# Patient Record
Sex: Female | Born: 1990 | Race: Black or African American | Hispanic: No | Marital: Single | Smoking: Never smoker
Health system: Southern US, Community
[De-identification: ages and names within clinical notes are randomized; demographics above are authoritative.]

## PROBLEM LIST (undated history)

## (undated) ENCOUNTER — Inpatient Hospital Stay (HOSPITAL_COMMUNITY): Payer: Self-pay

---
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
## 2010-12-14 LAB — CBC
Hemoglobin: 8.4 g/dL — ABNORMAL LOW (ref 12.0–15.0)
RBC: 3.31 MIL/uL — ABNORMAL LOW (ref 3.87–5.11)

## 2010-12-15 LAB — CBC
HCT: 29 % — ABNORMAL LOW (ref 36.0–46.0)
HCT: 34.2 % — ABNORMAL LOW (ref 36.0–46.0)
Hemoglobin: 11 g/dL — ABNORMAL LOW (ref 12.0–15.0)
MCHC: 32.2 g/dL (ref 30.0–36.0)
MCV: 78.8 fL (ref 78.0–100.0)
MCV: 78.9 fL (ref 78.0–100.0)
Platelets: 134 10*3/uL — ABNORMAL LOW (ref 150–400)
RBC: 4.34 MIL/uL (ref 3.87–5.11)
RDW: 18 % — ABNORMAL HIGH (ref 11.5–15.5)

## 2011-03-09 ENCOUNTER — Inpatient Hospital Stay (INDEPENDENT_AMBULATORY_CARE_PROVIDER_SITE_OTHER)
Admission: RE | Admit: 2011-03-09 | Discharge: 2011-03-09 | Disposition: A | Discharge status: Home / Self Care | Payer: Self-pay | Source: Ambulatory Visit | Attending: Emergency Medicine | Admitting: Emergency Medicine

## 2011-03-09 DIAGNOSIS — R112 Nausea with vomiting, unspecified: Secondary | ICD-10-CM

## 2011-03-09 DIAGNOSIS — R197 Diarrhea, unspecified: Secondary | ICD-10-CM

## 2011-03-09 LAB — POCT I-STAT, CHEM 8
BUN: 22 mg/dL (ref 6–23)
Calcium, Ion: 1.15 mmol/L (ref 1.12–1.32)
Glucose, Bld: 92 mg/dL (ref 70–99)
HCT: 49 % — ABNORMAL HIGH (ref 36.0–46.0)
TCO2: 27 mmol/L (ref 0–100)

## 2011-03-09 LAB — POCT URINALYSIS DIP (DEVICE)
Leukocytes, UA: NEGATIVE
Nitrite: NEGATIVE
Protein, ur: 30 mg/dL — AB
pH: 5.5 (ref 5.0–8.0)

## 2011-03-09 LAB — POCT PREGNANCY, URINE: Preg Test, Ur: NEGATIVE

## 2014-10-15 ENCOUNTER — Telehealth: Payer: Self-pay | Admitting: *Deleted

## 2014-10-15 NOTE — Telephone Encounter (Addendum)
Pt left message stating that she was referred to our office for Mirena removal. She has been having cramps and abnormal bleeding. She had the Mirena inserted in May of 2011 and has just started having problems. Her regular [facility] is [facility] [place]. I returned pt's call and advised her that I do not see referral information in her EMR. She stated that she was seen 3 weeks ago and has since been told by her doctor's office that they sent the referral information. I told pt that I will check with our scheduling staff and she will receive a call back within a few days. She stated that a message can be left on her voice mail.   *Message sent to registration staff, awaiting response.   2/4  1515  Called pt and left message on her personal voice mail stating that we have not received any referral information from her doctor's office.  She may schedule an appt in our office for IUD removal if she would like however the visit is not free.  She would need to make a payment on the Jamiya Nims of the appt and then would be billed for the remainder. The visit would cost approximately $250 and possibly more. She may also [facility] to [facility] as her cost would most likely be less. I stated the telephone numbers for our clinic and for the [facility] if she desires to schedule appt. She may also call back if she has additional questions.

## 2014-10-31 ENCOUNTER — Encounter: Payer: Self-pay | Admitting: *Deleted

## 2014-11-21 ENCOUNTER — Ambulatory Visit (INDEPENDENT_AMBULATORY_CARE_PROVIDER_SITE_OTHER): Payer: Self-pay | Admitting: Family Medicine

## 2014-11-21 ENCOUNTER — Encounter: Payer: Self-pay | Admitting: Family Medicine

## 2014-11-21 VITALS — BP 109/57 | HR 65 | Temp 98.7°F | Ht 64.0 in | Wt 132.2 lb

## 2014-11-21 DIAGNOSIS — Z30432 Encounter for removal of intrauterine contraceptive device: Secondary | ICD-10-CM

## 2014-11-21 MED ORDER — NORGESTIMATE-ETH ESTRADIOL 0.25-35 MG-MCG PO TABS: 1.0000 | ORAL_TABLET | Freq: Every day | ORAL | Status: AC

## 2014-11-21 NOTE — Progress Notes (Signed)
Patient here today for IUD removal. Had in placed in May 2011. Patient would like it removed because it is almost expired and she has been continuously bleeding since December 2015.

## 2014-11-21 NOTE — Progress Notes (Signed)
IUD Removal  Patient was in the dorsal lithotomy position, normal external genitalia was noted.  A speculum was placed in the patient's vagina, normal discharge was noted, no lesions. The multiparous cervix was visualized, no lesions, no abnormal discharge,  and was swabbed with Betadine using scopettes.  The strings of the IUD was grasped and pulled using ring forceps.  The IUD was successfully removed in its entirety.  Patient tolerated the procedure well.    Sprintec prescribed.  Side effects of medication discussed.

## 2015-01-01 ENCOUNTER — Encounter: Payer: Self-pay | Admitting: *Deleted

## 2016-05-12 ENCOUNTER — Emergency Department (HOSPITAL_BASED_OUTPATIENT_CLINIC_OR_DEPARTMENT_OTHER)
Admission: EM | Admit: 2016-05-12 | Discharge: 2016-05-12 | Disposition: A | Discharge status: Home / Self Care | Payer: Medicaid Other | Attending: Emergency Medicine | Admitting: Emergency Medicine

## 2016-05-12 ENCOUNTER — Emergency Department (HOSPITAL_BASED_OUTPATIENT_CLINIC_OR_DEPARTMENT_OTHER): Payer: Medicaid Other

## 2016-05-12 ENCOUNTER — Encounter (HOSPITAL_BASED_OUTPATIENT_CLINIC_OR_DEPARTMENT_OTHER): Payer: Self-pay

## 2016-05-12 DIAGNOSIS — R102 Pelvic and perineal pain: Secondary | ICD-10-CM | POA: Insufficient documentation

## 2016-05-12 DIAGNOSIS — O26891 Other specified pregnancy related conditions, first trimester: Secondary | ICD-10-CM | POA: Diagnosis present

## 2016-05-12 DIAGNOSIS — Z3A01 Less than 8 weeks gestation of pregnancy: Secondary | ICD-10-CM | POA: Diagnosis not present

## 2016-05-12 DIAGNOSIS — N938 Other specified abnormal uterine and vaginal bleeding: Secondary | ICD-10-CM | POA: Diagnosis not present

## 2016-05-12 DIAGNOSIS — O209 Hemorrhage in early pregnancy, unspecified: Secondary | ICD-10-CM | POA: Insufficient documentation

## 2016-05-12 DIAGNOSIS — IMO0001 Reserved for inherently not codable concepts without codable children: Secondary | ICD-10-CM

## 2016-05-12 DIAGNOSIS — O200 Threatened abortion: Secondary | ICD-10-CM

## 2016-05-12 LAB — CBC
HCT: 32.6 % — ABNORMAL LOW (ref 36.0–46.0)
Hemoglobin: 11.2 g/dL — ABNORMAL LOW (ref 12.0–15.0)
MCH: 27.7 pg (ref 26.0–34.0)
MCHC: 34.4 g/dL (ref 30.0–36.0)
MCV: 80.7 fL (ref 78.0–100.0)
PLATELETS: 181 10*3/uL (ref 150–400)
RBC: 4.04 MIL/uL (ref 3.87–5.11)
RDW: 12.9 % (ref 11.5–15.5)
WBC: 8.2 10*3/uL (ref 4.0–10.5)

## 2016-05-12 LAB — PREGNANCY, URINE: Preg Test, Ur: POSITIVE — AB

## 2016-05-12 LAB — WET PREP, GENITAL
Sperm: NONE SEEN
Trich, Wet Prep: NONE SEEN
Yeast Wet Prep HPF POC: NONE SEEN

## 2016-05-12 LAB — ABO/RH: ABO/RH(D): B POS

## 2016-05-12 LAB — HCG, QUANTITATIVE, PREGNANCY: HCG, BETA CHAIN, QUANT, S: 94766 m[IU]/mL — AB (ref <5)

## 2016-05-12 NOTE — ED Provider Notes (Signed)
[facility] Note   CSN: [id number] Arrival date & time: 05/12/16  1951  By signing my name below, I, Jasmyn B. Alexander, attest that this documentation has been prepared under the direction and in the presence of Pricilla Loveless, MD. Electronically Signed: Gillis Ends. Lyn Hollingshead, ED Scribe. 05/12/16. 8:16 PM.  History   Chief Complaint Chief Complaint  Patient presents with  . Vaginal Bleeding    The history is provided by the patient. No language interpreter was used.    HPI Comments: Laurie Evans is a 25 y.o. female who presents to the Emergency Department complaining of intermittent, mild vaginal bleeding x 1 day. She noted light "spotting" on the tissue after wiping. Pt states that blood was present on tissue on 05/11/16 and also today. She visited a [facility] clinic and was told that she is currently [redacted] weeks pregnant after urine pregnancy test. She has not received an ultrasound to confirm fetal heartbeat. Pt has an appointment with her PCP on 06/04/16. Pt has no associated symptoms. No alleviating factors noted. Father's blood type is A+. Pt does not know her blood type. Denies any dizziness, lightheadedness, abdominal pain, vaginal discharge, dysuria, or hematuria. LNMP was on July 28th. Pt has only been pregnant once before, G2P1.  History reviewed. No pertinent past medical history.  There are no active problems to display for this patient.  History reviewed. No pertinent surgical history.  OB History    Gravida Para Term Preterm AB Living   2 1 1  0 0 2   SAB TAB Ectopic Multiple Live Births   0 0 0 0 1     Home Medications    Prior to Admission medications   Medication Sig Start Date End Date Taking? Authorizing Provider  ferrous sulfate 325 (65 FE) MG tablet Take 325 mg by mouth daily with breakfast.    Historical Provider, MD  norgestimate-ethinyl estradiol (ORTHO-CYCLEN,SPRINTEC,PREVIFEM) 0.25-35 MG-MCG tablet Take 1 tablet by mouth daily. 11/21/14    Levie Heritage, DO    Family History No family history on file.  Social History Social History  Substance Use Topics  . Smoking status: Never Smoker  . Smokeless tobacco: Never Used  . Alcohol use Yes     Comment: social      Allergies   Penicillins   Review of Systems Review of Systems  Gastrointestinal: Negative for abdominal pain.  Genitourinary: Positive for vaginal bleeding. Negative for dysuria, hematuria and vaginal discharge.  Neurological: Negative for dizziness and headaches.  All other systems reviewed and are negative.  Physical Exam Updated Vital Signs BP 103/68 (BP Location: Left Arm)   Pulse 92   Temp 98.2 F (36.8 C) (Oral)   Resp 16   Ht 5\' 4"  (1.626 m)   Wt 120 lb (54.4 kg)   SpO2 100%   BMI 20.60 kg/m   Physical Exam  Constitutional: She is oriented to person, place, and time. She appears well-developed and well-nourished.  HENT:  Head: Normocephalic and atraumatic.  Right Ear: External ear normal.  Left Ear: External ear normal.  Nose: Nose normal.  Eyes: Right eye exhibits no discharge. Left eye exhibits no discharge.  Cardiovascular: Normal rate, regular rhythm and normal heart sounds.   Pulmonary/Chest: Effort normal and breath sounds normal.  Abdominal: Soft. There is no tenderness.  Genitourinary: Uterus is not tender. Right adnexum displays no mass and no tenderness. Left adnexum displays no mass and no tenderness. There is bleeding in the vagina.  Neurological: She is alert and oriented to person, place, and time.  Skin: Skin is warm and dry.  Nursing note and vitals reviewed.  ED Treatments / Results  DIAGNOSTIC STUDIES: Oxygen Saturation is 100% on RA, normal by my interpretation.    COORDINATION OF CARE: 8:11 PM-Discussed treatment plan which includes CBC, Pelvic, GC/Chlamydia, and HcG Quant with pt at bedside and pt agreed to plan.   Labs (all labs ordered are listed, but only abnormal results are displayed) Labs  Reviewed  WET PREP, GENITAL - Abnormal; Notable for the following:       Result Value   Clue Cells Wet Prep HPF POC PRESENT (*)    WBC, Wet Prep HPF POC MODERATE (*)    All other components within normal limits  PREGNANCY, URINE - Abnormal; Notable for the following:    Preg Test, Ur POSITIVE (*)    All other components within normal limits  CBC - Abnormal; Notable for the following:    Hemoglobin 11.2 (*)    HCT 32.6 (*)    All other components within normal limits  HCG, QUANTITATIVE, PREGNANCY - Abnormal; Notable for the following:    hCG, Beta Chain, Quant, S 94,766 (*)    All other components within normal limits  ABO/RH  GC/CHLAMYDIA PROBE AMP ([place]) NOT AT [facility]   Radiology [place] Ob Comp Less 14 Wks  Result Date: 05/12/2016 CLINICAL DATA:  Spotting for 2 days. EXAM: TWIN OBSTETRIC <14WK US AND TRANSVAGINAL OB US COMPARISON:  None. FINDINGS: Number of IUPs:  2 Chorionicity/Amnionicity:  Dichorionic-diamniotic (thick membrane) TWIN 1 Yolk sac:  Present Embryo:  Present Cardiac Activity: Present Heart Rate: Unable to detect CRL:  8.7  mm   6 w 6 d                  US EDC: 12/30/2016 TWIN 2 Yolk sac:  Present Embryo:  Not present Cardiac Activity: Not present MSD: 12.7  mm   6 w   1  d Subchorionic hemorrhage:  None visualized. Maternal uterus/adnexae: No adnexal mass. Small amount of pelvic free fluid. IMPRESSION: 1. Twin intrauterine gestational sacs. Twin 1 demonstrates a single live intrauterine pregnancy with visual cardiac activity, but an accurate heart rate is difficult to obtain. The second gestational sac demonstrates no fetal pole which may reflect a blighted ovum versus pregnancy too early to detect. Short-term follow-up ultrasound is recommended. Electronically Signed   By: Elige KoHetal  Patel   On: 05/12/2016 21:58   [place] Ob Comp Addl Gest Less 14 Wks  Result Date: 05/12/2016 CLINICAL DATA:  Spotting for 2 days. EXAM: TWIN OBSTETRIC <14WK US AND TRANSVAGINAL OB US COMPARISON:   None. FINDINGS: Number of IUPs:  2 Chorionicity/Amnionicity:  Dichorionic-diamniotic (thick membrane) TWIN 1 Yolk sac:  Present Embryo:  Present Cardiac Activity: Present Heart Rate: Unable to detect CRL:  8.7  mm   6 w 6 d                  US EDC: 12/30/2016 TWIN 2 Yolk sac:  Present Embryo:  Not present Cardiac Activity: Not present MSD: 12.7  mm   6 w   1  d Subchorionic hemorrhage:  None visualized. Maternal uterus/adnexae: No adnexal mass. Small amount of pelvic free fluid. IMPRESSION: 1. Twin intrauterine gestational sacs. Twin 1 demonstrates a single live intrauterine pregnancy with visual cardiac activity, but an accurate heart rate is difficult to obtain. The second gestational sac demonstrates no fetal pole which may  reflect a blighted ovum versus pregnancy too early to detect. Short-term follow-up ultrasound is recommended. Electronically Signed   By: Elige Ko   On: 05/12/2016 21:58   US Ob Transvaginal  Result Date: 05/12/2016 CLINICAL DATA:  Spotting for 2 days. EXAM: TWIN OBSTETRIC <14WK [place] AND TRANSVAGINAL OB US COMPARISON:  None. FINDINGS: Number of IUPs:  2 Chorionicity/Amnionicity:  Dichorionic-diamniotic (thick membrane) TWIN 1 Yolk sac:  Present Embryo:  Present Cardiac Activity: Present Heart Rate: Unable to detect CRL:  8.7  mm   6 w 6 d                  [place] EDC: 12/30/2016 TWIN 2 Yolk sac:  Present Embryo:  Not present Cardiac Activity: Not present MSD: 12.7  mm   6 w   1  d Subchorionic hemorrhage:  None visualized. Maternal uterus/adnexae: No adnexal mass. Small amount of pelvic free fluid. IMPRESSION: 1. Twin intrauterine gestational sacs. Twin 1 demonstrates a single live intrauterine pregnancy with visual cardiac activity, but an accurate heart rate is difficult to obtain. The second gestational sac demonstrates no fetal pole which may reflect a blighted ovum versus pregnancy too early to detect. Short-term follow-up ultrasound is recommended. Electronically Signed   By: Elige Ko   On: 05/12/2016 21:58   Procedures Procedures (including critical care time)  Medications Ordered in ED Medications - No data to display  Initial Impression / Assessment and Plan / ED Course  I have reviewed the triage vital signs and the nursing notes.  Pertinent labs & imaging results that were available during my care of the patient were reviewed by me and considered in my medical decision making (see chart for details).  Clinical Course    Patient presents with a threatened miscarriage. No urinary symptoms or infectious symptoms. No abdominal pain and her abdominal exam is benign. Ultrasound shows 2 gestational sacs, one with cardiac activity and one that appears to be either very early pregnancy versus blighted ovum. Discussed all this with the patient. She is to follow-up closely with her OB/GYN. Her blood type is B+. Discussed return precautions.  Final Clinical Impressions(s) / ED Diagnoses   Final diagnoses:  Vaginal bleeding in pregnancy, first trimester  Threatened miscarriage    New Prescriptions Discharge Medication List as of 05/12/2016 10:27 PM     I personally performed the services described in this documentation, which was scribed in my presence. The recorded information has been reviewed and is accurate.     Pricilla Loveless, MD 05/12/16 [phone]

## 2016-05-12 NOTE — ED Notes (Signed)
MD at bedside discussing results with patient and family at this time. 

## 2016-05-12 NOTE — ED Notes (Signed)
MD at bedside. 

## 2016-05-12 NOTE — ED Triage Notes (Signed)
Pt reports she was told last week that she was [redacted] weeks pregnant. Sts she was at a clinic in [place]. Reports spotting x 2 days. Denies pain or nausea at this time. G2P1

## 2016-05-12 NOTE — Discharge Instructions (Signed)
You will need another ultrasound and a couple weeks. Your OB/GYN can arrange this. If you develop abdominal pain or worsening bleeding see your OB/GYN or come back to the ER. Your blood type is B+

## 2016-05-13 LAB — GC/CHLAMYDIA PROBE AMP (~~LOC~~) NOT AT ARMC
Chlamydia: NEGATIVE
NEISSERIA GONORRHEA: NEGATIVE

## 2018-06-06 IMAGING — US US OB EACH ADDL GEST<[ID]
1 series · 14 of 28 positions shown · non-contrast
Comparison: None.

CLINICAL DATA: Spotting for 2 days.

EXAM:
TWIN OBSTETRIC <14WK US AND TRANSVAGINAL OB US

[Series 2: us ob each addl gest<(id) · 0.09mm/px · 34 acquisitions, 14 frames shown]
[im 2/34]
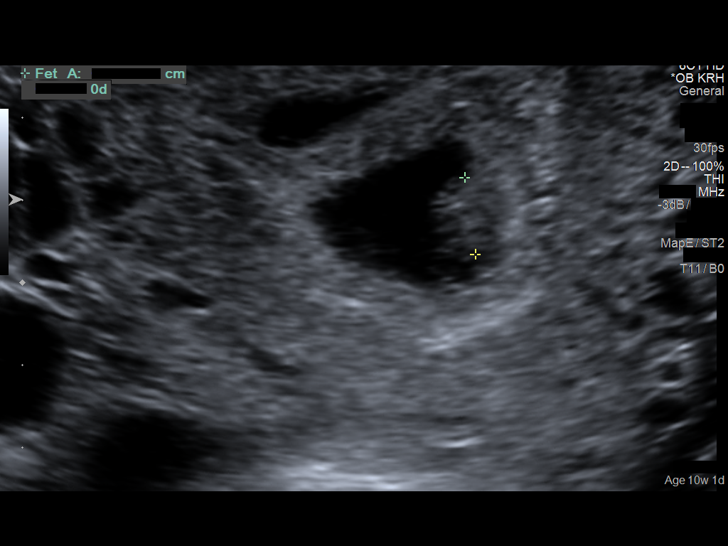
[im 4/34]
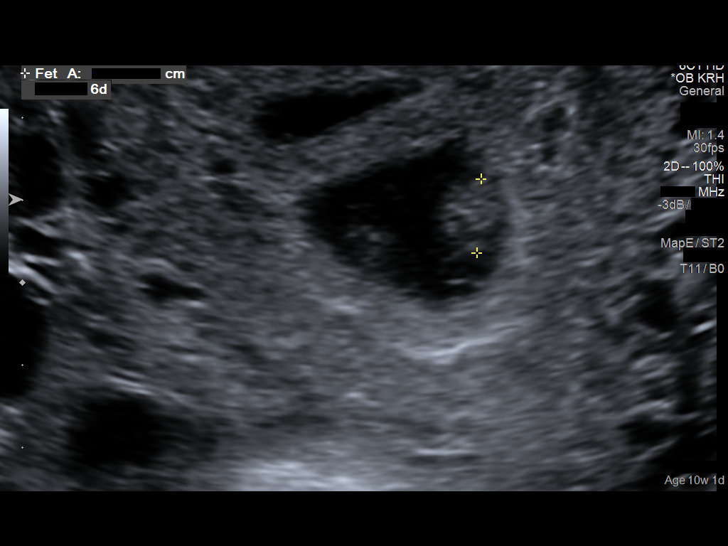
[im 7/34]
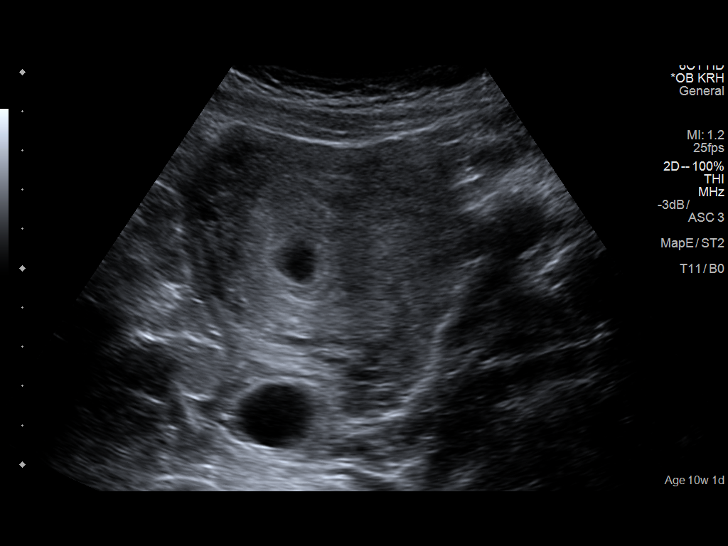
[im 9/34]
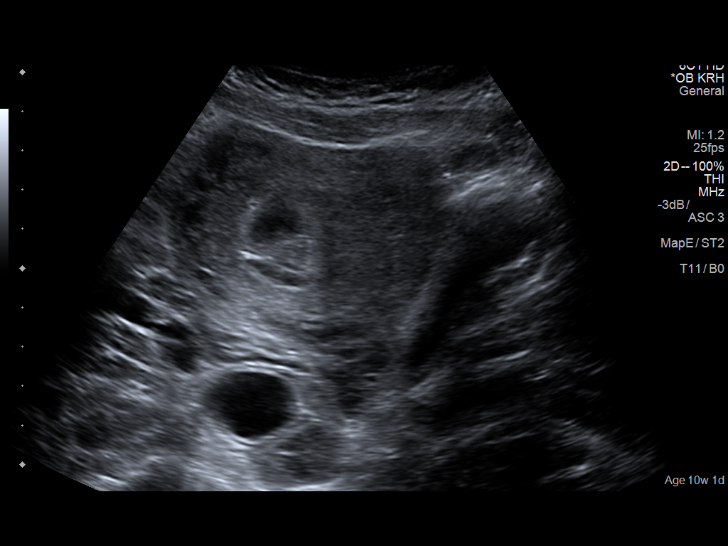
[im 12/34]
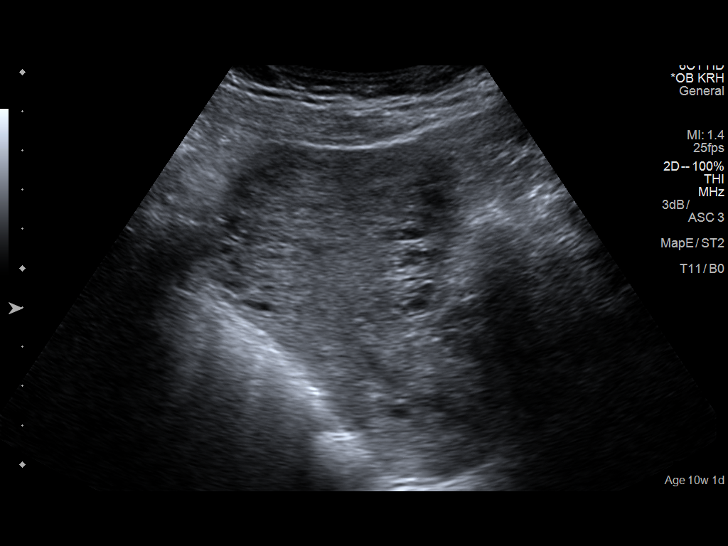
[im 14/34]
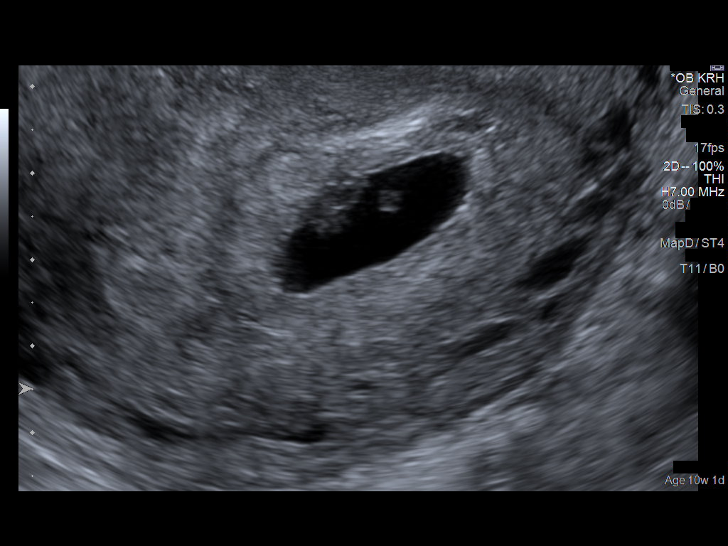
[im 16/34]
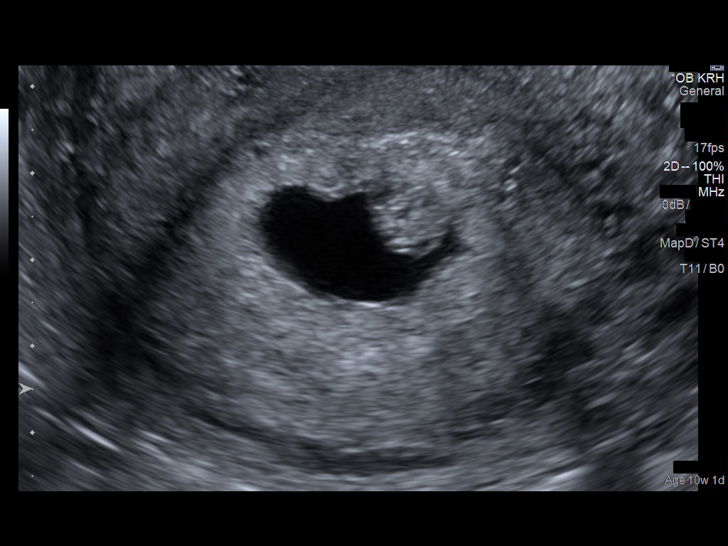
[im 19/34]
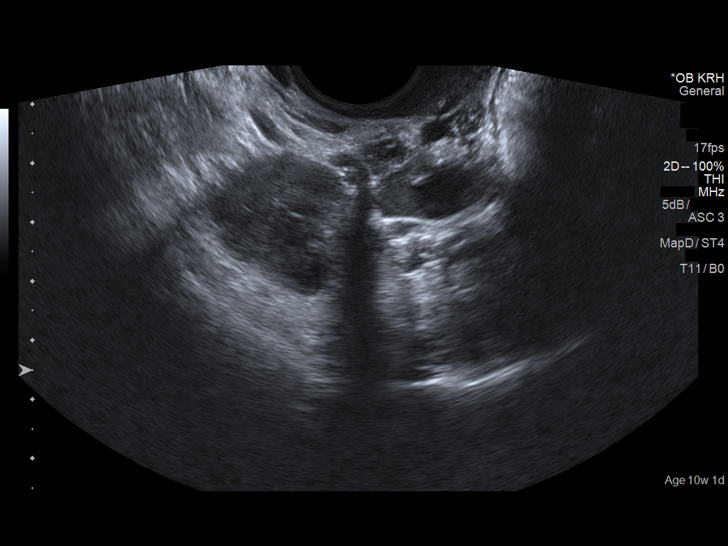
[im 21/34]
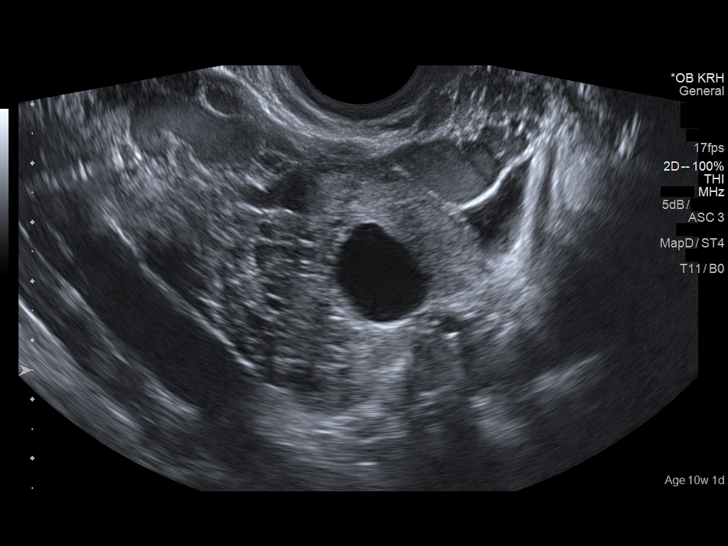
[im 24/34]
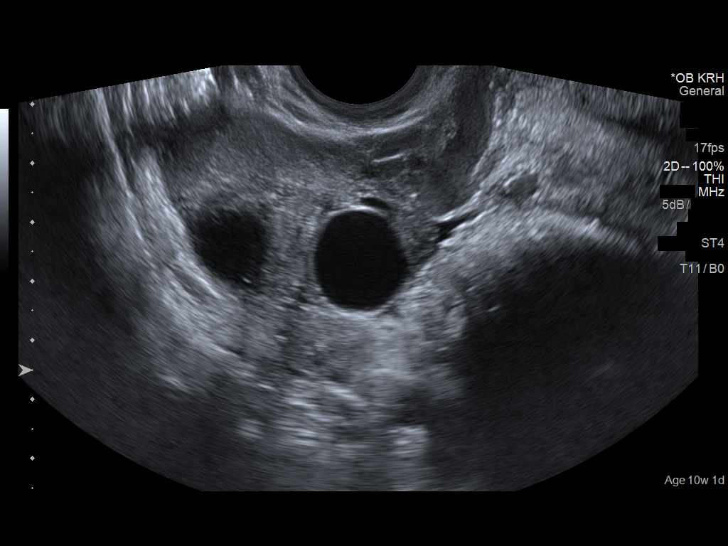
[im 26/34]
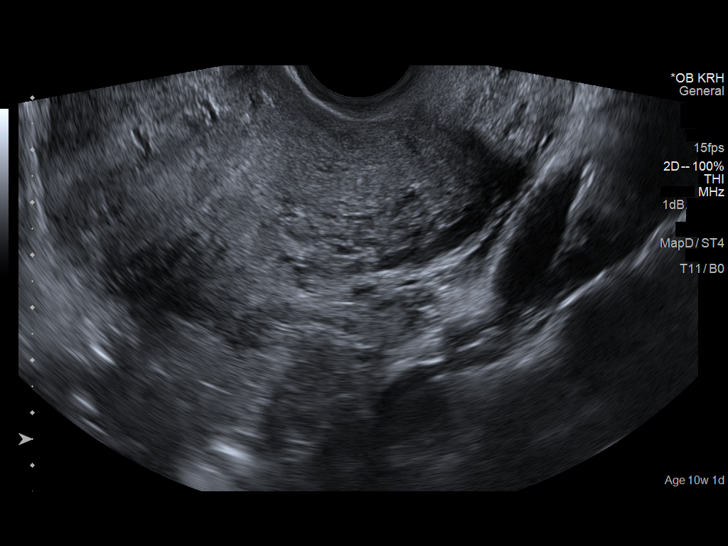
[im 29/34]
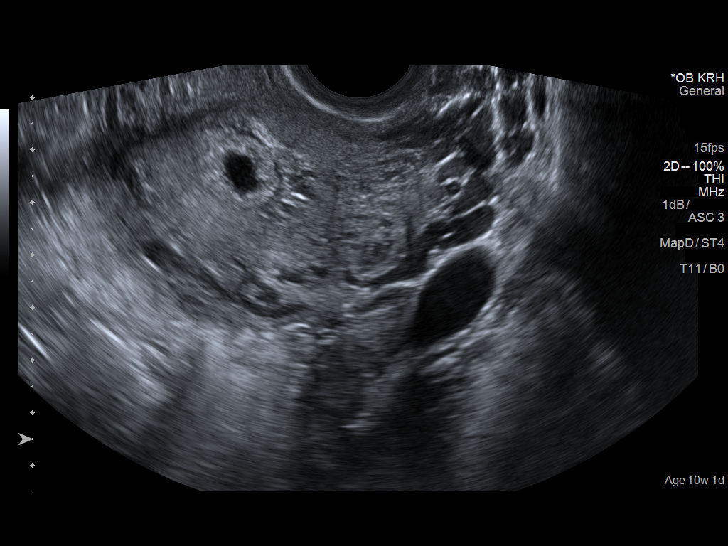
[im 31/34]
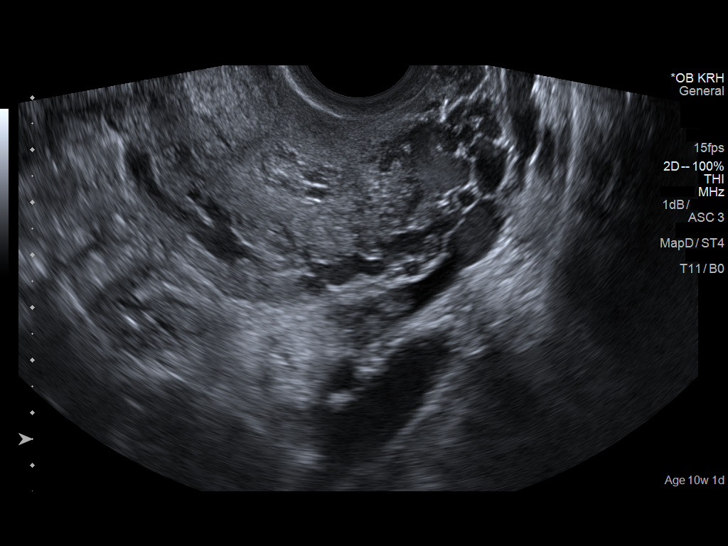
[im 34/34]
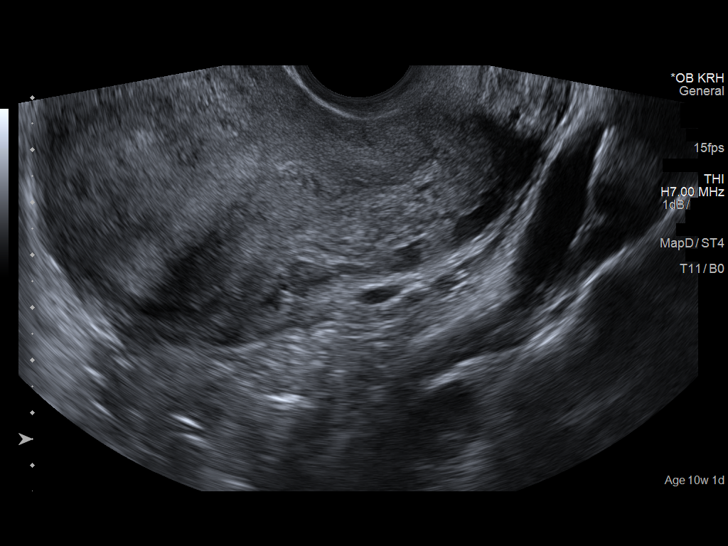

[14 of 28 positions shown; findings below may reference images not displayed]

FINDINGS: Number of IUPs:  2

Chorionicity/Amnionicity:  Dichorionic-diamniotic (thick membrane)

TWIN 1

Yolk sac:  Present

Embryo:  Present

Cardiac Activity: Present

Heart Rate: Unable to detect

CRL:  8.7  mm   6 w 6 d                  US EDC: 12/30/2016

TWIN 2

Yolk sac:  Present

Embryo:  Not present

Cardiac Activity: Not present

MSD: 12.7  mm   6 w   1  d

Subchorionic hemorrhage:  None visualized.

Maternal uterus/adnexae: No adnexal mass. Small amount of pelvic
free fluid.
IMPRESSION: 1. Twin intrauterine gestational sacs. Twin 1 demonstrates a single
live intrauterine pregnancy with visual cardiac activity, but an
accurate heart rate is difficult to obtain. The second gestational
sac demonstrates no fetal pole which may reflect a blighted ovum
versus pregnancy too early to detect. Short-term follow-up
ultrasound is recommended.

## 2019-06-24 ENCOUNTER — Other Ambulatory Visit: Payer: Self-pay

## 2019-06-24 ENCOUNTER — Emergency Department (HOSPITAL_COMMUNITY)
Admission: EM | Admit: 2019-06-24 | Discharge: 2019-06-24 | Disposition: A | Discharge status: Home / Self Care | Payer: Medicaid Other | Attending: Emergency Medicine | Admitting: Emergency Medicine

## 2019-06-24 DIAGNOSIS — M79652 Pain in left thigh: Secondary | ICD-10-CM | POA: Insufficient documentation

## 2019-06-24 DIAGNOSIS — M79605 Pain in left leg: Secondary | ICD-10-CM

## 2019-06-24 DIAGNOSIS — Z793 Long term (current) use of hormonal contraceptives: Secondary | ICD-10-CM | POA: Insufficient documentation

## 2019-06-24 MED ORDER — DICLOFENAC SODIUM 1 % TD GEL: 2.0000 g | Freq: Four times a day (QID) | TRANSDERMAL | 0 refills | Status: AC

## 2019-06-24 NOTE — ED Provider Notes (Signed)
[facility] EMERGENCY DEPARTMENT Provider Note   CSN: [id number] Arrival date & time: 06/24/19  1516     History   Chief Complaint Chief Complaint  Patient presents with  . Leg Pain    HPI Laurie Evans is a 28 y.o. female with no known past medical history here for evaluation of pain to the left upper thigh that began when she woke up this morning.  Reports achy pain and tingling.  She has no pain at rest and when sitting down.  Her pain worsens when there is pressure or tries to cross her legs or move her leg.  Interventions.  Alleviated completely with rest.  Denies any falls, trauma.  Does not think she slept oddly on that side but is unsure.  Denies associated back pain, buttock pain, lower extremity swelling, erythema.  No calf pain.  No falls or trauma or injuries.  No recent exercise.     HPI  No past medical history on file.  There are no active problems to display for this patient.   No past surgical history on file.   OB History    Gravida  2   Para  1   Term  1   Preterm  0   AB  0   Living  2     SAB  0   TAB  0   Ectopic  0   Multiple  0   Live Births  1            Home Medications    Prior to Admission medications   Medication Sig Start Date End Date Taking? Authorizing Provider  diclofenac sodium (VOLTAREN) 1 % GEL Apply 2 g topically 4 (four) times daily. 06/24/19   Liberty Handy, PA-C  ferrous sulfate 325 (65 FE) MG tablet Take 325 mg by mouth daily with breakfast.    [provider]  norgestimate-ethinyl estradiol (ORTHO-CYCLEN,SPRINTEC,PREVIFEM) 0.25-35 MG-MCG tablet Take 1 tablet by mouth daily. 11/21/14   Levie Heritage, DO    Family History No family history on file.  Social History Social History   Tobacco Use  . Smoking status: Never Smoker  . Smokeless tobacco: Never Used  Substance Use Topics  . Alcohol use: Yes    Comment: social   . Drug use: No     Allergies    Penicillins   Review of Systems Review of Systems  Musculoskeletal: Positive for myalgias.  All other systems reviewed and are negative.    Physical Exam Updated Vital Signs BP 133/72 (BP Location: Right Arm)   Pulse 96   Temp 100.1 F (37.8 C) (Oral)   Resp 12   Ht 5\' 5"  (1.651 m)   Wt 76.2 kg   SpO2 99%   BMI 27.96 kg/m   Physical Exam Constitutional:      Appearance: She is well-developed.  HENT:     Head: Normocephalic.     Nose: Nose normal.  Eyes:     General: Lids are normal.  Neck:     Musculoskeletal: Normal range of motion.  Cardiovascular:     Rate and Rhythm: Normal rate.     Comments: 1+ DP pulses bilaterally. No LE edema. No calf tenderness  Pulmonary:     Effort: Pulmonary effort is normal. No respiratory distress.  Musculoskeletal: Normal range of motion.        General: Tenderness present.     Comments: Focal mild tenderness to left mid/lateral proximal thigh.  No tenderness medially/laterally/posteriorly or distally on thigh otherwise. Mild pain with adduction of LLE and flexion against resistance.  No pain with abduction or SLR and hold. Skin normal. No focal bony tenderness to left hip prominences. No midline or paraspinal L spine tenderness. No sciatic notch or SI joint tenderness. Full ROM of knee, ankle without pain. Left knee, ankle without edema, tenderness.   Neurological:     Mental Status: She is alert.     Comments: Sensation and strength is intact in LE  Psychiatric:        Behavior: Behavior normal.      ED Treatments / Results  Labs (all labs ordered are listed, but only abnormal results are displayed) Labs Reviewed - No data to display  EKG None  Radiology No results found.  Procedures Procedures (including critical care time)  Medications Ordered in ED Medications - No data to display   Initial Impression / Assessment and Plan / ED Course  I have reviewed the triage vital signs and the nursing notes.  Pertinent  labs & imaging results that were available during my care of the patient were reviewed by me and considered in my medical decision making (see chart for details).   Highest on ddx is soft tissue injury of left upper quad/hip flexors.  No trauma. No focal bony tenderness to left hip, inguinal crease, L spine. I don't think emergent imaging is indicated today. No signs of cellulitis, abscess, rash. No LE edema, calf tenderness. No neuro or pulse deficits.  I doubt life threatening process. Recommended conservative symptomatic management of pain at home with NSAID, massage, voltaren gel. Return precautions given. Pt comfortable with plan.   Final Clinical Impressions(s) / ED Diagnoses   Final diagnoses:  Left leg pain    ED Discharge Orders         Ordered    diclofenac sodium (VOLTAREN) 1 % GEL  4 times daily     06/24/19 1624           Kinnie Feil, [place] 06/24/19 1821    Veryl Speak, MD 06/24/19 1955

## 2019-06-24 NOTE — ED Triage Notes (Signed)
Pt c/o L quad pain since waking this am, able to ambulate.

## 2019-06-24 NOTE — Discharge Instructions (Signed)
You were seen in the ER for left upper leg pain  Exact cause is unclear but I don't think a life threatening process is going on.  Most likely, this is a soft tissue injury, contusion, strain  For pain and inflammation you can use a combination of ibuprofen and acetaminophen.  Take [phone] mg acetaminophen (tylenol) every 6 hours or 600 mg ibuprofen (advil, motrin) every 6 hours.  You can take these separately or combine them every 6 hours for maximum pain control. Do not exceed 4,000 mg acetaminophen or 2,400 mg ibuprofen in a 24 hour period.  Do not take ibuprofen containing products if you are pregnant, have history of kidney disease, ulcers, GI bleeding, severe acid reflux, or take a blood thinner.  Do not take acetaminophen if you have liver disease.   Apply voltaren pain gel to the area and massage. Ice. Stretch.   Return for worsening pain, swelling, redness, warmth to skin, calf pain or swelling, severe back pain or loss of sensation or weakness in extremity

## 2021-11-03 ENCOUNTER — Other Ambulatory Visit: Payer: Self-pay | Admitting: Family Medicine

## 2021-11-03 ENCOUNTER — Other Ambulatory Visit (HOSPITAL_COMMUNITY)
Admission: RE | Admit: 2021-11-03 | Discharge: 2021-11-03 | Disposition: A | Discharge status: Home / Self Care | Payer: No Typology Code available for payment source | Source: Ambulatory Visit | Attending: Family Medicine | Admitting: Family Medicine

## 2021-11-03 DIAGNOSIS — Z01419 Encounter for gynecological examination (general) (routine) without abnormal findings: Secondary | ICD-10-CM | POA: Diagnosis present

## 2021-11-06 LAB — CYTOLOGY - PAP
Comment: NEGATIVE
Diagnosis: UNDETERMINED — AB
High risk HPV: NEGATIVE
